# Patient Record
Sex: Female | Born: 1960 | Race: Asian | Hispanic: No | Marital: Married | Smoking: Never smoker
Health system: Southern US, Community
[De-identification: ages and names within clinical notes are randomized; demographics above are authoritative.]

---

## 2003-03-27 ENCOUNTER — Other Ambulatory Visit: Admission: RE | Admit: 2003-03-27 | Discharge: 2003-03-27 | Payer: Self-pay | Admitting: Obstetrics and Gynecology

## 2003-05-19 ENCOUNTER — Ambulatory Visit (HOSPITAL_COMMUNITY): Admission: RE | Admit: 2003-05-19 | Discharge: 2003-05-19 | Payer: Self-pay | Admitting: Obstetrics and Gynecology

## 2003-09-29 ENCOUNTER — Encounter (INDEPENDENT_AMBULATORY_CARE_PROVIDER_SITE_OTHER): Payer: Self-pay | Admitting: Specialist

## 2003-09-29 ENCOUNTER — Inpatient Hospital Stay (HOSPITAL_COMMUNITY): Admission: AD | Admit: 2003-09-29 | Discharge: 2003-10-02 | Payer: Self-pay | Admitting: Obstetrics and Gynecology

## 2004-03-30 ENCOUNTER — Other Ambulatory Visit: Admission: RE | Admit: 2004-03-30 | Discharge: 2004-03-30 | Payer: Self-pay | Admitting: Obstetrics and Gynecology

## 2004-06-09 ENCOUNTER — Ambulatory Visit (HOSPITAL_COMMUNITY): Admission: RE | Admit: 2004-06-09 | Discharge: 2004-06-09 | Payer: Self-pay | Admitting: Obstetrics and Gynecology

## 2004-06-09 ENCOUNTER — Encounter: Payer: Self-pay | Admitting: Obstetrics and Gynecology

## 2004-06-09 ENCOUNTER — Encounter (INDEPENDENT_AMBULATORY_CARE_PROVIDER_SITE_OTHER): Payer: Self-pay | Admitting: Specialist

## 2006-01-23 ENCOUNTER — Other Ambulatory Visit: Admission: RE | Admit: 2006-01-23 | Discharge: 2006-01-23 | Payer: Self-pay | Admitting: Obstetrics and Gynecology

## 2006-01-24 ENCOUNTER — Ambulatory Visit (HOSPITAL_COMMUNITY): Admission: RE | Admit: 2006-01-24 | Discharge: 2006-01-24 | Payer: Self-pay | Admitting: Family Medicine

## 2007-01-29 ENCOUNTER — Ambulatory Visit (HOSPITAL_COMMUNITY): Admission: RE | Admit: 2007-01-29 | Discharge: 2007-01-29 | Payer: Self-pay | Admitting: Family Medicine

## 2008-01-28 ENCOUNTER — Encounter: Admission: RE | Admit: 2008-01-28 | Discharge: 2008-01-28 | Payer: Self-pay | Admitting: Family Medicine

## 2008-01-30 ENCOUNTER — Ambulatory Visit (HOSPITAL_COMMUNITY): Admission: RE | Admit: 2008-01-30 | Discharge: 2008-01-30 | Payer: Self-pay | Admitting: Family Medicine

## 2008-02-20 ENCOUNTER — Encounter: Admission: RE | Admit: 2008-02-20 | Discharge: 2008-02-20 | Payer: Self-pay | Admitting: Family Medicine

## 2009-02-02 ENCOUNTER — Ambulatory Visit (HOSPITAL_COMMUNITY): Admission: RE | Admit: 2009-02-02 | Discharge: 2009-02-02 | Payer: Self-pay | Admitting: Family Medicine

## 2010-02-15 ENCOUNTER — Ambulatory Visit (HOSPITAL_COMMUNITY)
Admission: RE | Admit: 2010-02-15 | Discharge: 2010-02-15 | Payer: Self-pay | Source: Home / Self Care | Admitting: Family Medicine

## 2010-04-03 ENCOUNTER — Encounter: Payer: Self-pay | Admitting: Obstetrics and Gynecology

## 2010-07-30 NOTE — Op Note (Signed)
NAMEPRABHLEEN, [name] [name]              ACCOUNT NO.:  [id number]   MEDICAL RECORD NO.:  [id number]          PATIENT TYPE:  AMB   LOCATION:  SDC                           [facility]:  [facility]   PHYSICIAN:  Hal Morales, M.D.DATE OF BIRTH:  02/19/1961   DATE OF PROCEDURE:  06/09/2004  DATE OF DISCHARGE:                                 OPERATIVE REPORT   PREOPERATIVE DIAGNOSES:  1.  Intrauterine fetal demise at seven weeks; gestation.  2.  Advanced maternal age of 24.   POSTOPERATIVE DIAGNOSES:  1.  Intrauterine fetal demise at seven weeks; gestation.  2.  Advanced maternal age of 54.   OPERATIONS:  Suction dilatation and evacuation.   SURGEON:  Hal Morales, M.D.   ANESTHESIA:  Monitored anesthesia care and local with 0.5% Marcaine with  epinephrine for a total of 10 mL.   ESTIMATED BLOOD LOSS:  Less than 10 mL.   COMPLICATIONS:  None.   FINDINGS:  The uterus was enlarged to approximately eight-week size with a  large amount of products of conception obtained at dilatation and  evacuation.   PROCEDURE:  The patient was taken to the operating room after appropriate  identification and placed on the operating table.  After placement of  equipment for monitored anesthesia care, she was placed in the lithotomy  position.  The perineum and vagina were prepped with multiple layers of  Betadine and a red Robinson catheter used to empty the bladder.  The  perineum was draped as a sterile field.  A Graves speculum was placed in the  vagina and a paracervical block achieved with a total of 10 mL of 0.5%  Marcaine with epinephrine.  The cervix was then dilated to accommodate a #7  suction curette.  That suction curette was used to suction-evacuate all  products of conception from all quadrants of the uterus.  A sharp curette  was used to ensure that all products of conception had been removed.  A  single-tooth tenaculum was placed on the cervix prior to beginning the  dilation.  Once  the suction curettage was complete, all instruments were  removed from the vagina and a suture of 0 Vicryl was used to achieve  hemostasis at the site of the tenaculum stick, which was still bleeding.  Hemostasis was then noted to be adequate and the patient was taken from the  operating room to the recovery room in satisfactory condition having  tolerated the procedure well, with sponge and instrument counts correct. The  specimen went to pathology and to [facility] for chromosome analysis.  Postoperative instructions were  printed instructions for D&E from the [facility].  Discharge  medications:  Ibuprofen over-the-counter 600 mg p.o. q.6h.  p.r.n. pain,  doxycycline 100 mg p.o. b.i.d. for 5 days, and Methergine 0.2 mg p.o. q.6h.  for a total of eight doses.  Follow-up is in two weeks.      VPH/MEDQ  D:  06/09/2004  T:  06/09/2004  Job:  [id number]

## 2010-07-30 NOTE — Discharge Summary (Signed)
[name] [name]                        ACCOUNT NO.:  [id number]   MEDICAL RECORD NO.:  [id number]                   PATIENT TYPE:  INP   LOCATION:  9311                                 [facility]ILITY:  [facility]   PHYSICIAN:  Janine Limbo, M.D.            DATE OF BIRTH:  [birth year]   DATE OF ADMISSION:  09/29/2003  DATE OF DISCHARGE:  10/02/2003                                 DISCHARGE SUMMARY   ADMISSION DIAGNOSES:  1. Intrauterine pregnancy at [redacted] weeks gestation.  2. Intrauterine fetal demise.  3. Unfavorable cervix.  4. Abnormal fetal lie.   DISCHARGE DIAGNOSES:  1. Intrauterine pregnancy at [redacted] weeks gestation.  2. Intrauterine fetal demise.  3. Unfavorable cervix.  4. Abnormal fetal lie.  5. Status post cesarean section delivery.  6. Appropriate grief process.   PROCEDURES THIS ADMISSION:  Primary low transverse cesarean section for  delivery of a nonviable female infant named [name] [name] who weighed 8 pounds 10  ounces on September 29, 2003 attended in delivery by Dr. Leonard Schwartz  and Nigel Bridgeman, C.N.M.   HOSPITAL COURSE:  [name] [name] is a 50 year old married Filipino female  gravida 1 para 0 at 58 and four-sevenths weeks who presented on admission  with an intrauterine fetal demise discovered at the office.  She was  admitted for delivery and underwent a cesarean section for delivery due to  abnormal fetal lie in the transverse position.  She was delivered of a  nonviable female infant who had Apgars of 0 and 0 and weighed 8 pounds 10  ounces.  They did name him [name] [name].  He was delivered by Dr. Leonard Schwartz and Nigel Bridgeman, C.N.M.  Please see operative note for further  details.  Postoperatively the patient has done well within the appropriate  grief process.  She is ambulating, voiding, and eating without difficulty.  Her vital signs have been stable and she has been essentially afebrile  throughout her hospital course but did have one temperature the  evening of  July 20 that was 100.5.  She is deemed ready for discharge today.   DISCHARGE INSTRUCTIONS:  Call for fever, increased bleeding, or signs or  symptoms of increasing depression.   DISCHARGE MEDICATIONS:  1. Motrin 600 mg p.o. q.6h. p.r.n. for pain.  2. Tylox one to two p.o. q.4-6h. p.r.n. for pain.   DISCHARGE LABORATORY DATA:  Today she had a CBC that had a wbc count of  13.5, down from 15, hemoglobin of 9.2, and platelets 349.  She has multiple  other tests pending for coag studies, hormonal studies, and thrombophilia  screening, as well as TORCH titers.   FOLLOW-UP:  Her discharge follow-up will be in 4-6 weeks at [facility] or p.r.n. and she was also referred to [facility] support group in  [place] for parents with loss.   DISCHARGE STATUS:  Well and stable.     Vance Gather  J. Duplantis, C.N.M.              Janine Limbo, M.D.    SJD/MEDQ  D:  10/02/2003  T:  10/03/2003  Job:  [id number]

## 2010-07-30 NOTE — Op Note (Signed)
[name] [name], [name] [name]                        ACCOUNT NO.:  [id number]   MEDICAL RECORD NO.:  [id number]                   PATIENT TYPE:  INP   LOCATION:  9311                                 [facility]   PHYSICIAN:  Janine Limbo, M.D.            DATE OF BIRTH:  30-Jan-1961   DATE OF PROCEDURE:  09/29/2003  DATE OF DISCHARGE:                                 OPERATIVE REPORT   PREOPERATIVE DIAGNOSES:  1. [redacted] weeks gestation.  2. Intrauterine fetal demise.  3. Transverse presentation.   POSTOPERATIVE DIAGNOSES:  1. [redacted] weeks gestation.  2. Intrauterine fetal demise.  3. Transverse presentation.   PROCEDURE:  Primary low transverse cesarean section.   SURGEON:  Janine Limbo, M.D.   FIRST ASSISTANT:  Renaldo Reel. Emilee Hero, C.N.M.   ANESTHESIA:  Epidural.   DISPOSITION:  [name] is a 50 year old female, gravida 1, para 0, who  presents at [redacted] weeks gestation.  The patient reports not feeling her baby  move and on presentation to the office today no fetal heart tones could be  appreciated. An ultrasound was performed that confirmed an intrauterine  fetal demise. The patient denies bleeding and leakage of fluid.  The  ultrasound showed that the infant was in a transverse presentation.  The  patient's cervix was closed and long.  We discussed the options for  management with the patient.  Her husband was also present. We offered an  attempt at external version.  We would then plan induction of labor.  We  also offered a cesarean delivery. The risk and benefits of each of those  options were reviewed. After considering her options, the patient and her  husband elected not to have an external version but rather to proceed with  cesarean delivery. We reviewed the risk of cesarean delivery which included,  but are not limited to, anesthetic complications, bleeding, infections, and  possible damage to the surrounding organs.   FINDINGS:  The weight of the infant is currently  not known. A female infant  was delivered from a back up transverse presentation.  The Apgars were 0 at  1 minute and 0 at 5 minutes. The skin on the infant was starting to peel and  this was thought to be consistent with an intrauterine fetal demise of  several days duration.  The skull was still firm.  The intrauterine cavity  was evaluated and no abnormalities were noted.  The remainder of the pelvic  exam was normal. There was some meconium stained amniotic fluid present.  The placenta appeared completely normal.  The cord was thrombosed but there  were no knots and there were no nuchal cords or body cords present. The  amniotic fluid was not foul smelling and there was no evidence of infection.   DESCRIPTION OF PROCEDURE:  The patient was taken to the operating room where  an epidural anesthetic was given. The patient's abdomen was prepped  with  multiple layers of Betadine as was the perineum.  A Foley catheter was  placed in the bladder.  The patient was then sterilely draped. The lower  abdomen was injected with 10 mL of 0.5% Marcaine with epinephrine.  A low  transverse incision was made and the incision was carried sharply through  the subcutaneous tissue, the fascia and the anterior peritoneum.  The lower  uterine segment was incised and the bladder flap was developed.  The  incision was extended in a low transverse fashion. The infant was then  rotated to a vertex presentation. The fetal head was delivered.  The Mighty  Vac vacuum extractor was used.  The remainder of the infant was then  delivered. The cord was clamped and cut and the infant was taken to be  cleaned and dressed.  The skin was starting to break down as mentioned  above. The placenta was removed and the cavity was explored. The cavity was  then cleaned. The uterine incision was closed using a running locking suture  of 2-0 Vicryl followed by an imbricating suture of 2-0 Vicryl.  Figure-of-  eight sutures of 2-0  Vicryl were used for hemostasis.  The pelvis was  vigorously irrigated. No abnormalities were noted.  The anterior peritoneum  and then the abdominal musculature were reapproximated in the midline using  3-0 Vicryl.  The subcutaneous layer and the fascia were irrigated. The  fascia was closed using a running suture of #0 Vicryl followed by three  interrupted sutures of 3-0 Vicryl.  Hemostasis was noted to be adequate. The  subcutaneous layer was then closed using #0 Vicryl. The skin was  reapproximated using a subcuticular suture of 4-0 Vicryl.  Sponge, needle  and instrument counts were correct on two occasions. The estimated blood  loss for the procedure was 800 mL.  The patient tolerated the procedure  well.  The infant was taken back to Labor and Delivery and preparations were  made for funeral services. The mother was taken to the recovery room in  stable condition. The placenta was sent to pathology for evaluation.                                               Janine Limbo, M.D.    AVS/MEDQ  D:  09/29/2003  T:  09/30/2003  Job:  [id number]

## 2010-07-30 NOTE — H&P (Signed)
NAME:  [name] [name], [name] [name]                        ACCOUNT NO.:  [id number]   MEDICAL RECORD NO.:  [id number]                   PATIENT TYPE:  INP   LOCATION:  9176                                 [facility]   PHYSICIAN:  Janine Limbo, M.D.            DATE OF BIRTH:  16-Jun-1960   DATE OF ADMISSION:  09/29/2003  DATE OF DISCHARGE:                                HISTORY & PHYSICAL   HISTORY OF PRESENT ILLNESS:  Ms. Vasallo is a 50 year old gravida 1 para 0 at  88 and four-sevenths weeks who presents for admission for an intrauterine  fetal demise discovered today i the office.  The patient reports no fetal  movement since yesterday with one movement noted that day.  She denies  abdominal pain, bleeding, discharge, trauma, headache, or any other symptom.  The patient, of course, is very distraught at this news.  Ultrasound today  showed an IUFD, transverse lie, estimated fetal weight 60th to 61st  percentile.  Pregnancy has been remarkable for:  1. Advanced maternal age with amniocentesis declined with a normal quadruple     screen and positive nasal bone seen.  2. Irregular cycles.  3. 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   PRENATAL LABORATORY DATA:  Blood type is B positive, Rh antibody negative.  VDRL nonreactive.  Rubella titer positive.  Hepatitis B surface antigen  negative.  HIV nonreactive.  GC and chlamydia cultures were negative at  first visit.  Pap was normal.  Cystic fibrosis testing was negative.  Quadruple screen was normal.  Hemoglobin upon entering the practice was  13.3; it was 12.9 at 27 weeks.  RPR was also nonreactive at that time.  EDC  of October 02, 2003 was established by first trimester ultrasound at 13 weeks  secondary to history of irregular cycles and was in agreement with  ultrasound at approximately 18 weeks.  Group B strep culture, chlamydia, and  GBS were done at 36 weeks and were negative.   HISTORY OF PRESENT PREGNANCY:  The patient entered care at approximately  12  weeks.  She was initially undecided regarding amniocentesis but then did  decide against that.  She elected a quadruple screen which was normal.  She  had a normal ultrasound at 18 weeks that was done at [facility] that  showed normal cervical growth, normal anatomy, good fluid, nasal bone seen.  Her Glucola was normal at 110.  RPR was nonreactive at 27 weeks.  She had a  total of a 26-pound weight gain with a 4-pound loss in the last 6 days.  The  patient had no other problems.  Her last visit was in the office on July 12  at which time she was having no difficulty.  She was to have an ultrasound  at her follow-up visit for size and position.  Ultrasound today showed a  nonviable fetus in a transverse lie, estimated weight of 3514 g to  3527 g, 7  pounds 12 ounces, that presents the 60th to 61st percentile.   OBSTETRICAL HISTORY:  The patient is a primigravida.   MEDICAL HISTORY:  She had Trichomonas which was treated with metronidazole  10 years ago.  She reports the usual childhood illnesses.  She has a history  of a positive PPD with a negative chest x-ray in 2000.  She had asthma as a  child but no problems after that.  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   ALLERGIES:  She has no known medication allergies.   FAMILY HISTORY:  Father has heart disease.  Father and brother and paternal  uncle have hypertension.  Maternal grandfather and paternal grandfather had  a history of TB.  There is a questionable family history of rheumatoid  arthritis.   GENETIC HISTORY:  Remarkable for the patient being age 64 at the time of  delivery.  The father-of-the-baby's brother has a cleft lip.  The patient's  brothers and sisters have __________ and glaucoma.  The patient's mother and  father are second cousins.  The patient's brother and cousins are twins.  The father of the baby's is 29 years old.   SOCIAL HISTORY:  The patient is married to the father of the baby.  He is  involved  and supportive.  His name is Keiasia Christianson.  The patient is [place].  She is of the [employer].  She is college-educated and employed as  a Designer, jewellery.  Her husband has 2 years of college.  He is working  toward his L.P.N.  The patient is employed at the [employer].  The patient denies any alcohol, drug, or tobacco use during this pregnancy.   PHYSICAL EXAMINATION:  VITAL SIGNS:  Stable; the patient is afebrile.  HEENT:  Within normal limits.  LUNGS:  Bilateral breath sounds are clear.  HEART:  Regular rate and rhythm without murmur.  BREASTS:  Soft and nontender.  ABDOMEN:  Fundal height is approximately 39-40 cm.  PELVIC:  Closed, soft, with no presenting part noted in the pelvis.  EXTREMITIES:  Deep tendon reflexes are 2+ without clonus.  There is a trace  edema noted.   Clean catch urine was negative at the office.   IMPRESSION:  1. Intrauterine fetal demise at 43 and four-sevenths weeks.  2. Unripe cervix.  3. Abnormal lie.   PLAN:  1. Admit to [facility] per consult with Dr. Stefano Gaul as     attending physician.  2. Routine physician orders.  3. TORCH titers, comprehensive metabolic panel with admit labs.  4. Support the patient and husband regarding loss.  5. Review status of the cervix with the patient and her husband.  6. Dr. Stefano Gaul will follow the patient and determine the plan of care.     Renaldo Reel Emilee Hero, C.N.M.                   Janine Limbo, M.D.    VLL/MEDQ  D:  09/29/2003  T:  09/29/2003  Job:  [id number]

## 2011-01-27 ENCOUNTER — Other Ambulatory Visit (HOSPITAL_COMMUNITY): Payer: Self-pay | Admitting: Family Medicine

## 2011-01-27 DIAGNOSIS — Z1231 Encounter for screening mammogram for malignant neoplasm of breast: Secondary | ICD-10-CM

## 2011-02-28 ENCOUNTER — Ambulatory Visit (HOSPITAL_COMMUNITY): Admission: RE | Admit: 2011-02-28 | Payer: Self-pay | Source: Ambulatory Visit

## 2011-03-02 ENCOUNTER — Ambulatory Visit (HOSPITAL_COMMUNITY)
Admission: RE | Admit: 2011-03-02 | Discharge: 2011-03-02 | Disposition: A | Discharge status: Home / Self Care | Payer: PRIVATE HEALTH INSURANCE | Source: Ambulatory Visit | Attending: Family Medicine | Admitting: Family Medicine

## 2011-03-02 DIAGNOSIS — Z1231 Encounter for screening mammogram for malignant neoplasm of breast: Secondary | ICD-10-CM | POA: Insufficient documentation

## 2011-03-09 ENCOUNTER — Other Ambulatory Visit: Payer: Self-pay | Admitting: Family Medicine

## 2011-03-09 DIAGNOSIS — R928 Other abnormal and inconclusive findings on diagnostic imaging of breast: Secondary | ICD-10-CM

## 2011-03-16 ENCOUNTER — Ambulatory Visit
Admission: RE | Admit: 2011-03-16 | Discharge: 2011-03-16 | Disposition: A | Discharge status: Home / Self Care | Payer: PRIVATE HEALTH INSURANCE | Source: Ambulatory Visit | Attending: Family Medicine | Admitting: Family Medicine

## 2011-03-16 ENCOUNTER — Other Ambulatory Visit: Payer: Self-pay | Admitting: Family Medicine

## 2011-03-16 DIAGNOSIS — R921 Mammographic calcification found on diagnostic imaging of breast: Secondary | ICD-10-CM

## 2011-03-16 DIAGNOSIS — R928 Other abnormal and inconclusive findings on diagnostic imaging of breast: Secondary | ICD-10-CM

## 2011-03-23 ENCOUNTER — Ambulatory Visit
Admission: RE | Admit: 2011-03-23 | Discharge: 2011-03-23 | Disposition: A | Discharge status: Home / Self Care | Payer: PRIVATE HEALTH INSURANCE | Source: Ambulatory Visit | Attending: Family Medicine | Admitting: Family Medicine

## 2011-03-23 DIAGNOSIS — R921 Mammographic calcification found on diagnostic imaging of breast: Secondary | ICD-10-CM

## 2011-03-24 ENCOUNTER — Other Ambulatory Visit: Payer: Self-pay | Admitting: Family Medicine

## 2011-03-24 DIAGNOSIS — R921 Mammographic calcification found on diagnostic imaging of breast: Secondary | ICD-10-CM

## 2011-03-29 ENCOUNTER — Ambulatory Visit
Admission: RE | Admit: 2011-03-29 | Discharge: 2011-03-29 | Disposition: A | Discharge status: Home / Self Care | Payer: PRIVATE HEALTH INSURANCE | Source: Ambulatory Visit | Attending: Family Medicine | Admitting: Family Medicine

## 2011-03-29 DIAGNOSIS — R921 Mammographic calcification found on diagnostic imaging of breast: Secondary | ICD-10-CM

## 2011-04-06 ENCOUNTER — Ambulatory Visit (INDEPENDENT_AMBULATORY_CARE_PROVIDER_SITE_OTHER): Payer: Self-pay | Admitting: Surgery

## 2011-04-11 ENCOUNTER — Ambulatory Visit (INDEPENDENT_AMBULATORY_CARE_PROVIDER_SITE_OTHER): Payer: PRIVATE HEALTH INSURANCE | Admitting: General Surgery

## 2011-09-12 ENCOUNTER — Other Ambulatory Visit: Payer: Self-pay | Admitting: Family Medicine

## 2011-09-12 DIAGNOSIS — R921 Mammographic calcification found on diagnostic imaging of breast: Secondary | ICD-10-CM

## 2011-09-20 ENCOUNTER — Ambulatory Visit
Admission: RE | Admit: 2011-09-20 | Discharge: 2011-09-20 | Disposition: A | Discharge status: Home / Self Care | Payer: PRIVATE HEALTH INSURANCE | Source: Ambulatory Visit | Attending: Family Medicine | Admitting: Family Medicine

## 2011-09-20 DIAGNOSIS — R921 Mammographic calcification found on diagnostic imaging of breast: Secondary | ICD-10-CM

## 2012-02-06 ENCOUNTER — Other Ambulatory Visit: Payer: Self-pay | Admitting: Family Medicine

## 2012-02-06 DIAGNOSIS — N644 Mastodynia: Secondary | ICD-10-CM

## 2012-02-10 ENCOUNTER — Other Ambulatory Visit: Payer: Self-pay | Admitting: Family Medicine

## 2012-02-10 ENCOUNTER — Ambulatory Visit
Admission: RE | Admit: 2012-02-10 | Discharge: 2012-02-10 | Disposition: A | Discharge status: Home / Self Care | Payer: PRIVATE HEALTH INSURANCE | Source: Ambulatory Visit | Attending: Family Medicine | Admitting: Family Medicine

## 2012-02-10 DIAGNOSIS — N644 Mastodynia: Secondary | ICD-10-CM

## 2012-04-03 ENCOUNTER — Ambulatory Visit
Admission: RE | Admit: 2012-04-03 | Discharge: 2012-04-03 | Disposition: A | Discharge status: Home / Self Care | Payer: PRIVATE HEALTH INSURANCE | Source: Ambulatory Visit | Attending: Family Medicine | Admitting: Family Medicine

## 2012-04-03 DIAGNOSIS — N644 Mastodynia: Secondary | ICD-10-CM

## 2012-08-08 ENCOUNTER — Encounter (HOSPITAL_COMMUNITY): Payer: Self-pay | Admitting: Emergency Medicine

## 2012-08-08 DIAGNOSIS — Y929 Unspecified place or not applicable: Secondary | ICD-10-CM | POA: Insufficient documentation

## 2012-08-08 DIAGNOSIS — Y9389 Activity, other specified: Secondary | ICD-10-CM | POA: Insufficient documentation

## 2012-08-08 DIAGNOSIS — S61209A Unspecified open wound of unspecified finger without damage to nail, initial encounter: Secondary | ICD-10-CM | POA: Insufficient documentation

## 2012-08-08 DIAGNOSIS — W460XXA Contact with hypodermic needle, initial encounter: Secondary | ICD-10-CM | POA: Insufficient documentation

## 2012-08-08 NOTE — ED Notes (Signed)
PT. ACCIDENTALLY STUCK HER LEFT 5TH FINGER WITH INSULIN SYRINGE NEEDLE AFTER GIVING INSULIN TO A PT. THIS EVENING . NO BLEEDING .

## 2012-08-09 ENCOUNTER — Emergency Department (HOSPITAL_COMMUNITY)
Admission: EM | Admit: 2012-08-09 | Discharge: 2012-08-09 | Disposition: A | Discharge status: Home / Self Care | Payer: Worker's Compensation | Attending: Emergency Medicine | Admitting: Emergency Medicine

## 2012-08-09 DIAGNOSIS — IMO0001 Reserved for inherently not codable concepts without codable children: Secondary | ICD-10-CM

## 2012-08-09 LAB — HEPATIC FUNCTION PANEL
ALT: 31 U/L (ref 0–35)
AST: 29 U/L (ref 0–37)
Alkaline Phosphatase: 54 U/L (ref 39–117)
Bilirubin, Direct: <0.1 mg/dL (ref 0.0–0.3)
Total Protein: 7.8 g/dL (ref 6.0–8.3)

## 2012-08-09 LAB — HIV ANTIBODY (ROUTINE TESTING W REFLEX): HIV: NONREACTIVE

## 2012-08-09 NOTE — ED Provider Notes (Signed)
History     CSN: [id number]  Arrival date & time 08/08/12  2203   First MD Initiated Contact with Patient 08/09/12 0119      Chief Complaint  Patient presents with  . Finger Injury    (Consider location/radiation/quality/duration/timing/severity/associated sxs/prior treatment) HPI [name] [name] is a 52 y.o. female presents emergency department after accidentally sticking herself with a contaminated syringe while at work.  Patient states that she is here with papers for work.  Asymptomatic at this time.  Encounter HIV and hepatitis status currently unknown.  Patient reports flushing and cleaning the site thoroughly.  History reviewed. No pertinent past medical history.  History reviewed. No pertinent past surgical history.  No family history on file.  History  Substance Use Topics  . Smoking status: Never Smoker   . Smokeless tobacco: Not on file  . Alcohol Use: No    OB History   Grav Para Term Preterm Abortions TAB SAB Ect Mult Living                  Review of Systems Ten systems reviewed and are negative for acute change, except as noted in the HPI.   Allergies  Review of patient's allergies indicates no known allergies.  Home Medications  No current outpatient prescriptions on file.  BP 132/77  Pulse 64  Temp(Src) 97.9 F (36.6 C) (Oral)  Resp 16  SpO2 100%  Physical Exam  Constitutional: She is oriented to person, place, and time. She appears well-developed and well-nourished. No distress.  HENT:  Head: Normocephalic and atraumatic.  Mouth/Throat: Oropharynx is clear and moist. No oropharyngeal exudate.  Eyes: Conjunctivae and EOM are normal. Pupils are equal, round, and reactive to light. No scleral icterus.  Neck: Normal range of motion. Neck supple. No tracheal deviation present. No thyromegaly present.  Cardiovascular: Normal rate, regular rhythm, normal heart sounds and intact distal pulses.   Pulmonary/Chest: Effort normal and breath sounds  normal. No stridor. No respiratory distress. She has no wheezes.  Abdominal: Soft.  Musculoskeletal: Normal range of motion. She exhibits no edema and no tenderness.  Neurological: She is alert and oriented to person, place, and time. Coordination normal.  Skin: Skin is warm and dry. No rash noted. She is not diaphoretic. No erythema. No pallor.  Psychiatric: She has a normal mood and affect. Her behavior is normal.    ED Course  Procedures (including critical care time)  Labs Reviewed  HEPATIC FUNCTION PANEL  HIV ANTIBODY (ROUTINE TESTING)  HEPATITIS C ANTIBODY   No results found.   1. Needle stick injury, initial encounter       MDM  Labs ordered for baseline based on [place] protocol for needle stick injury.  Patient advised to contact her employee health to assure that he in patients labs were drawn as well.  Jaci Carrel, [place] 08/09/12 [phone]

## 2012-08-09 NOTE — ED Notes (Signed)
Pt comfortable with d/c and f/u instructions. No prescriptions 

## 2012-08-11 NOTE — ED Provider Notes (Signed)
Medical screening examination/treatment/procedure(s) were performed by non-physician practitioner and as supervising physician I was immediately available for consultation/collaboration.   Julie Manly, MD 08/11/12 0015 

## 2013-02-28 ENCOUNTER — Other Ambulatory Visit: Payer: Self-pay | Admitting: Family Medicine

## 2013-02-28 DIAGNOSIS — R921 Mammographic calcification found on diagnostic imaging of breast: Secondary | ICD-10-CM

## 2013-04-10 ENCOUNTER — Other Ambulatory Visit: Payer: Self-pay | Admitting: Family Medicine

## 2013-04-10 ENCOUNTER — Ambulatory Visit
Admission: RE | Admit: 2013-04-10 | Discharge: 2013-04-10 | Disposition: A | Discharge status: Home / Self Care | Payer: PRIVATE HEALTH INSURANCE | Source: Ambulatory Visit | Attending: Family Medicine | Admitting: Family Medicine

## 2013-04-10 DIAGNOSIS — R921 Mammographic calcification found on diagnostic imaging of breast: Secondary | ICD-10-CM

## 2013-05-06 ENCOUNTER — Other Ambulatory Visit: Payer: Self-pay | Admitting: Family Medicine

## 2013-05-06 ENCOUNTER — Other Ambulatory Visit (HOSPITAL_COMMUNITY)
Admission: RE | Admit: 2013-05-06 | Discharge: 2013-05-06 | Disposition: A | Discharge status: Home / Self Care | Payer: PRIVATE HEALTH INSURANCE | Source: Ambulatory Visit | Attending: Family Medicine | Admitting: Family Medicine

## 2013-05-06 DIAGNOSIS — Z124 Encounter for screening for malignant neoplasm of cervix: Secondary | ICD-10-CM | POA: Insufficient documentation

## 2014-02-25 ENCOUNTER — Other Ambulatory Visit: Payer: Self-pay

## 2014-02-25 DIAGNOSIS — Z1231 Encounter for screening mammogram for malignant neoplasm of breast: Secondary | ICD-10-CM

## 2014-04-14 ENCOUNTER — Ambulatory Visit
Admission: RE | Admit: 2014-04-14 | Discharge: 2014-04-14 | Disposition: A | Discharge status: Home / Self Care | Payer: PRIVATE HEALTH INSURANCE | Source: Ambulatory Visit

## 2014-04-14 DIAGNOSIS — Z1231 Encounter for screening mammogram for malignant neoplasm of breast: Secondary | ICD-10-CM

## 2014-06-01 ENCOUNTER — Ambulatory Visit: Payer: Self-pay

## 2014-06-01 ENCOUNTER — Ambulatory Visit (INDEPENDENT_AMBULATORY_CARE_PROVIDER_SITE_OTHER): Payer: Worker's Compensation | Admitting: Family Medicine

## 2014-06-01 VITALS — BP 120/60 | HR 70 | Temp 98.5°F | Resp 16 | Ht 59.0 in | Wt 102.4 lb

## 2014-06-01 DIAGNOSIS — S76011A Strain of muscle, fascia and tendon of right hip, initial encounter: Secondary | ICD-10-CM | POA: Diagnosis not present

## 2014-06-01 DIAGNOSIS — M25551 Pain in right hip: Secondary | ICD-10-CM

## 2014-06-01 DIAGNOSIS — M7071 Other bursitis of hip, right hip: Secondary | ICD-10-CM

## 2014-06-01 MED ORDER — IBUPROFEN 800 MG PO TABS: 800.0000 mg | ORAL_TABLET | Freq: Three times a day (TID) | ORAL | Status: DC | PRN

## 2014-06-01 NOTE — Patient Instructions (Signed)
Hip Bursitis Bursitis is a swelling and soreness (inflammation) of a fluid-filled sac (bursa). This sac overlies and protects the joints.  CAUSES   Injury.  Overuse of the muscles surrounding the joint.  Arthritis.  Gout.  Infection.  Cold weather.  Inadequate warm-up and conditioning prior to activities. The cause may not be known.  SYMPTOMS   Mild to severe irritation.  Tenderness and swelling over the outside of the hip.  Pain with motion of the hip.  If the bursa becomes infected, a fever may be present. Redness, tenderness, and warmth will develop over the hip. Symptoms usually lessen in 3 to 4 weeks with treatment, but can come back. TREATMENT If conservative treatment does not work, your caregiver may advise draining the bursa and injecting cortisone into the area. This may speed up the healing process. This may also be used as an initial treatment of choice. HOME CARE INSTRUCTIONS   Apply ice to the affected area for 15-20 minutes every 3 to 4 hours while awake for the first 2 days. Put the ice in a plastic bag and place a towel between the bag of ice and your skin.  Rest the painful joint as much as possible, but continue to put the joint through a normal range of motion at least 4 times per day. When the pain lessens, begin normal, slow movements and usual activities to help prevent stiffness of the hip.  Only take over-the-counter or prescription medicines for pain, discomfort, or fever as directed by your caregiver.  Use crutches to limit weight bearing on the hip joint, if advised.  Elevate your painful hip to reduce swelling. Use pillows for propping and cushioning your legs and hips.  Gentle massage may provide comfort and decrease swelling. SEEK IMMEDIATE MEDICAL CARE IF:   Your pain increases even during treatment, or you are not improving.  You have a fever.  You have heat and inflammation over the involved bursa.  You have any other questions or  concerns. MAKE SURE YOU:   Understand these instructions.  Will watch your condition.  Will get help right away if you are not doing well or get worse. Document Released: 08/20/2001 Document Revised: 05/23/2011 Document Reviewed: 03/19/2008 [facility] Patient Information 2015 [place], [place]. This information is not intended to replace advice given to you by your health care provider. Make sure you discuss any questions you have with your health care provider. Trochanteric Bursitis You have hip pain due to trochanteric bursitis. Bursitis means that the sack near the outside of the hip is filled with fluid and inflamed. This sack is made up of protective soft tissue. The pain from trochanteric bursitis can be severe and keep you from sleep. It can radiate to the buttocks or down the outside of the thigh to the knee. The pain is almost always worse when rising from the seated or lying position and with walking. Pain can improve after you take a few steps. It happens more often in people with hip joint and lumbar spine problems, such as arthritis or previous surgery. Very rarely the trochanteric bursa can become infected, and antibiotics and/or surgery may be needed. Treatment often includes an injection of local anesthetic mixed with cortisone medicine. This medicine is injected into the area where it is most tender over the hip. Repeat injections may be necessary if the response to treatment is slow. You can apply ice packs over the tender area for 30 minutes every 2 hours for the next few days. Anti-inflammatory  and/or narcotic pain medicine may also be helpful. Limit your activity for the next few days if the pain continues. See your caregiver in 5-10 days if you are not greatly improved.  SEEK IMMEDIATE MEDICAL CARE IF:  You develop severe pain, fever, or increased redness.  You have pain that radiates below the knee. EXERCISES STRETCHING EXERCISES - Trochanteric Bursitis  These exercises may  help you when beginning to rehabilitate your injury. Your symptoms may resolve with or without further involvement from your physician, physical therapist, or athletic trainer. While completing these exercises, remember:   Restoring tissue flexibility helps normal motion to return to the joints. This allows healthier, less painful movement and activity.  An effective stretch should be held for at least 30 seconds.  A stretch should never be painful. You should only feel a gentle lengthening or release in the stretched tissue. STRETCH - Iliotibial Band  On the floor or bed, lie on your side so your injured leg is on top. Bend your knee and grab your ankle.  Slowly bring your knee back so that your thigh is in line with your trunk. Keep your heel at your buttocks and gently arch your back so your head, shoulders and hips line up.  Slowly lower your leg so that your knee approaches the floor/bed until you feel a gentle stretch on the outside of your thigh. If you do not feel a stretch and your knee will not fall farther, place the heel of your opposite foot on top of your knee and pull your thigh down farther.  Hold this stretch for __________ seconds.  Repeat __________ times. Complete this exercise __________ times per day. STRETCH - Hamstrings, Supine   Lie on your back. Loop a belt or towel over the ball of your foot as shown.  Straighten your knee and slowly pull on the belt to raise your injured leg. Do not allow the knee to bend. Keep your opposite leg flat on the floor.  Raise the leg until you feel a gentle stretch behind your knee or thigh. Hold this position for __________ seconds.  Repeat __________ times. Complete this stretch __________ times per day. STRETCH - Quadriceps, Prone   Lie on your stomach on a firm surface, such as a bed or padded floor.  Bend your knee and grasp your ankle. If you are unable to reach your ankle or pant leg, use a belt around your foot to  lengthen your reach.  Gently pull your heel toward your buttocks. Your knee should not slide out to the side. You should feel a stretch in the front of your thigh and/or knee.  Hold this position for __________ seconds.  Repeat __________ times. Complete this stretch __________ times per day. STRETCHING - Hip Flexors, Lunge Half kneel with your knee on the floor and your opposite knee bent and directly over your ankle.  Keep good posture with your head over your shoulders. Tighten your buttocks to point your tailbone downward; this will prevent your back from arching too much.  You should feel a gentle stretch in the front of your thigh and/or hip. If you do not feel any resistance, slightly slide your opposite foot forward and then slowly lunge forward so your knee once again lines up over your ankle. Be sure your tailbone remains pointed downward.  Hold this stretch for __________ seconds.  Repeat __________ times. Complete this stretch __________ times per day. STRETCH - Adductors, Lunge  While standing, spread your legs.  Lean away from your injured leg by bending your opposite knee. You may rest your hands on your thigh for balance.  You should feel a stretch in your inner thigh. Hold for __________ seconds.  Repeat __________ times. Complete this exercise __________ times per day. Document Released: 04/07/2004 Document Revised: 07/15/2013 Document Reviewed: 06/12/2008 [facility] Patient Information 2015 [place], [place]. This information is not intended to replace advice given to you by your health care provider. Make sure you discuss any questions you have with your health care provider.

## 2014-06-01 NOTE — Progress Notes (Signed)
Subjective:  This chart was scribed for [name] SorensonEva Shaw, MD, by Elon SpannerGarrett Cook, Medical Scribe. This patient was seen in room Rm3 and the patient's care was started at 10:27 AM.   Patient ID: [name] Largeeresita L Ned, female    DOB: 10/15/1960, 54 y.o.   MRN: [id number] Chief Complaint  Patient presents with  . Hip Injury    pt was at work and helped to pull a resident up in bed, now with right hip pain    HPI HPI Comments: [name] [name] is a 54 y.o. female who [facility] complaining of a non-radiating, constant, waxing and waning right lateral hip pain after a lifting injury at work yesterday morning.  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  She reports the pain may have worsened as she continued to walk on the injury yesterday but denies relief without.  Patient has not taken any medications or iced area of complaint.  Patient denies previous history of hip injury.  Patient denies difficulty ambulating, going up stairs.  Patient denies weakness.    History reviewed. No pertinent past medical history. No current outpatient prescriptions on file prior to visit.   No current facility-administered medications on file prior to visit.   No Known Allergies    Review of Systems  Constitutional: Positive for activity change. Negative for fever, chills, diaphoresis, appetite change and fatigue.  Gastrointestinal: Negative for abdominal pain.  Musculoskeletal: Positive for myalgias, arthralgias and gait problem. Negative for back pain, joint swelling and neck pain.  Skin: Negative for color change and rash.  Allergic/Immunologic: Negative for immunocompromised state.  Neurological: Negative for dizziness, tremors, syncope, weakness and numbness.  Hematological: Does not bruise/bleed easily.  Psychiatric/Behavioral: Positive for sleep disturbance.       Objective:  BP 120/60 mmHg  Pulse 70  Temp(Src) 98.5  F (36.9 C) (Oral)  Resp 16  Ht 4\' 11"  (1.499 m)  Wt 102 lb 6 oz (46.437 kg)  BMI 20.67 kg/m2  SpO2 98%  LMP 03/02/2011  Physical Exam  Constitutional: She is oriented to person, place, and time. She appears well-developed and well-nourished. No distress.  HENT:  Head: Normocephalic and atraumatic.  Eyes: Conjunctivae and EOM are normal.  Neck: Neck supple. No tracheal deviation present.  Cardiovascular: Normal rate.   Pulmonary/Chest: Effort normal. No respiratory distress.  Musculoskeletal: Normal range of motion.  No lower extremity edema.  2+ pedal pulses.  Point TTP over greater trochanter with some minimal swelling.  No radiation.  Left hip no tenderness.  FROM, internal/external rotation on left.  FROM on right with flexion, internal/external rotation but does have increased pain with external rotation.   No point tenderness over lumbar spin paraspinal or SI joints.  Antalgic gait  Neurological: She is alert and oriented to person, place, and time. Gait abnormal.  Skin: Skin is warm and dry.  Psychiatric: She has a normal mood and affect. Her behavior is normal.  Nursing note and vitals reviewed.   Primary X-ray Reading by Dr.  Clelia CroftShaw: right hip x-ray normal.  Large stool burden with pelvic phlebolith noted.  Dg Hip Unilat With Pelvis 2-3 Views Right  06/01/2014   CLINICAL DATA:  Right hip pain.  EXAM: RIGHT HIP (WITH PELVIS) 2-3 VIEWS  COMPARISON:  None.  FINDINGS: No fracture. Hip joints, SI joints and symphysis pubis are normally spaced and aligned. Soft tissues are unremarkable.  Electronically Signed   By: Amie Portland M.D.   On: 06/01/2014 12:14       Assessment & Plan:  10:48 AM Discussed suspicion of trochanteric bursitis vs hip strain but will order imagine of hip to r/o other injury. RICE and out of work x 2d w/ minimal ambulation.  Then RTC light duty - minimal lifting, no stairs, reg breaks for icing. Advised patient to start home stretching exercise.  If no  improvement observed in 1-2 week, patient advised to return for potential cortisone injection.  Right hip pain - Plan: DG HIP UNILAT WITH PELVIS 2-3 VIEWS RIGHT  Bursitis of right hip  Meds ordered this encounter  Medications  . Omega-3 Fatty Acids (FISH OIL) 1000 MG CAPS    Sig: Take 1 capsule by mouth daily.  Marland Kitchen ibuprofen (ADVIL,MOTRIN) 800 MG tablet    Sig: Take 1 tablet (800 mg total) by mouth every 8 (eight) hours as needed for moderate pain.    Dispense:  60 tablet    Refill:  0    I personally performed the services described in this documentation, which was scribed in my presence. The recorded information has been reviewed and considered, and addended by me as needed.  [name] Sorenson, MD MPH

## 2014-06-10 ENCOUNTER — Ambulatory Visit (INDEPENDENT_AMBULATORY_CARE_PROVIDER_SITE_OTHER): Payer: Worker's Compensation | Admitting: Family Medicine

## 2014-06-10 VITALS — BP 118/64 | HR 59 | Temp 98.0°F | Resp 20 | Ht 59.0 in | Wt 104.5 lb

## 2014-06-10 DIAGNOSIS — S76012D Strain of muscle, fascia and tendon of left hip, subsequent encounter: Secondary | ICD-10-CM

## 2014-06-10 NOTE — Progress Notes (Signed)
Subjective:    Patient ID: [name] [name], female    DOB: 12/20/1960, 54 y.o.   MRN: [id number]  This chart was scribed for Sherren MochaEva N Jacobs Golab, MD by Ronney LionSuzanne Le, ED Scribe. This patient was seen in room 9 and the patient's care was started at 2:08 PM.   HPI   Chief Complaint  Patient presents with  . Follow-up    follow up with Dr. Clelia CroftShaw for her right hip injury.           HPI Comments: [name] [name] is a 54 y.o. female who presents to the [facility] for a worker's comp claim follow-up for right lateral hip pain following a lifting injury at work that occurred 10 days ago. 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   Today, patient reports she has had no right hip pain since being seen by me 10 days ago. Patient has taken 3 tablets of anti-inflammatory medication and her symptoms afterwards resolved. She does note some back pain when she is pushing something heavy but is otherwise pain-free.  History reviewed. No pertinent past medical history.   Current Outpatient Prescriptions on File Prior to Visit  Medication Sig Dispense Refill  . Omega-3 Fatty Acids (FISH OIL) 1000 MG CAPS Take 1 capsule by mouth daily.     No current facility-administered medications on file prior to visit.    No Known Allergies    Review of Systems  Constitutional: Negative for fever and chills.  HENT: Negative for rhinorrhea and sore throat.   Eyes: Negative for visual disturbance.  Respiratory: Negative for cough and shortness of breath.   Cardiovascular: Negative for chest pain and leg swelling.  Gastrointestinal: Negative for nausea, vomiting, abdominal pain and diarrhea.  Genitourinary: Negative for dysuria.  Musculoskeletal: Negative for myalgias and back pain.  Skin: Negative for rash.  Neurological: Negative for headaches.  Hematological: Does not bruise/bleed easily.       Objective:  BP  118/64 mmHg  Pulse 59  Temp(Src) 98 F (36.7 C) (Oral)  Resp 20  Ht 4\' 11"  (1.499 m)  Wt 104 lb 8 oz (47.401 kg)  BMI 21.10 kg/m2  SpO2 100%  LMP 03/02/2011  Physical Exam  Constitutional: She is oriented to person, place, and time. She appears well-developed and well-nourished. No distress.  HENT:  Head: Normocephalic and atraumatic.  Eyes: Conjunctivae and EOM are normal.  Neck: Neck supple. No tracheal deviation present.  Cardiovascular: Normal rate.   Pulmonary/Chest: Effort normal. No respiratory distress.  Musculoskeletal: Normal range of motion. She exhibits no tenderness.  No TTP over iliac crest or right greater trochanter.  Neurological: She is alert and oriented to person, place, and time.  Reflex Scores:      Patellar reflexes are 2+ on the right side and 2+ on the left side.      Achilles reflexes are 2+ on the right side and 2+ on the left side. Skin: Skin is warm and dry.  Psychiatric: She has a normal mood and affect. Her behavior is normal.  Nursing note and vitals reviewed.      Assessment & Plan:  2:13 PM - Patient appears to be doing very well since her last injury, denying any pain.   Back to full duty and if pt is tolerating that w/o recurrence of sxs will close case. F/U prn   I personally performed the  services described in this documentation, which was scribed in my presence. The recorded information has been reviewed and considered, and addended by me as needed.  Delman Cheadle, MD MPH

## 2015-03-30 ENCOUNTER — Other Ambulatory Visit: Payer: Self-pay

## 2015-03-30 DIAGNOSIS — Z1231 Encounter for screening mammogram for malignant neoplasm of breast: Secondary | ICD-10-CM

## 2015-04-20 ENCOUNTER — Ambulatory Visit: Payer: Self-pay

## 2015-04-29 ENCOUNTER — Ambulatory Visit
Admission: RE | Admit: 2015-04-29 | Discharge: 2015-04-29 | Disposition: A | Discharge status: Home / Self Care | Payer: PRIVATE HEALTH INSURANCE | Source: Ambulatory Visit

## 2015-04-29 DIAGNOSIS — Z1231 Encounter for screening mammogram for malignant neoplasm of breast: Secondary | ICD-10-CM

## 2015-10-13 ENCOUNTER — Ambulatory Visit
Admission: RE | Admit: 2015-10-13 | Discharge: 2015-10-13 | Disposition: A | Discharge status: Home / Self Care | Payer: Managed Care, Other (non HMO) | Source: Ambulatory Visit | Attending: Family Medicine | Admitting: Family Medicine

## 2015-10-13 ENCOUNTER — Other Ambulatory Visit: Payer: Self-pay | Admitting: Family Medicine

## 2015-10-13 DIAGNOSIS — L0291 Cutaneous abscess, unspecified: Secondary | ICD-10-CM

## 2015-10-13 DIAGNOSIS — R05 Cough: Secondary | ICD-10-CM

## 2015-10-13 DIAGNOSIS — R059 Cough, unspecified: Secondary | ICD-10-CM

## 2015-11-20 ENCOUNTER — Other Ambulatory Visit: Payer: Self-pay | Admitting: Family Medicine

## 2015-11-20 ENCOUNTER — Ambulatory Visit
Admission: RE | Admit: 2015-11-20 | Discharge: 2015-11-20 | Disposition: A | Discharge status: Home / Self Care | Payer: Managed Care, Other (non HMO) | Source: Ambulatory Visit | Attending: Family Medicine | Admitting: Family Medicine

## 2015-11-20 DIAGNOSIS — J189 Pneumonia, unspecified organism: Secondary | ICD-10-CM

## 2016-04-25 ENCOUNTER — Other Ambulatory Visit: Payer: Self-pay | Admitting: Family Medicine

## 2016-04-25 DIAGNOSIS — Z1231 Encounter for screening mammogram for malignant neoplasm of breast: Secondary | ICD-10-CM

## 2016-05-09 ENCOUNTER — Ambulatory Visit
Admission: RE | Admit: 2016-05-09 | Discharge: 2016-05-09 | Disposition: A | Discharge status: Home / Self Care | Payer: BLUE CROSS/BLUE SHIELD | Source: Ambulatory Visit | Attending: Family Medicine | Admitting: Family Medicine

## 2016-05-09 DIAGNOSIS — Z1231 Encounter for screening mammogram for malignant neoplasm of breast: Secondary | ICD-10-CM

## 2016-06-15 ENCOUNTER — Other Ambulatory Visit: Payer: Self-pay | Admitting: Family Medicine

## 2016-06-15 ENCOUNTER — Ambulatory Visit
Admission: RE | Admit: 2016-06-15 | Discharge: 2016-06-15 | Disposition: A | Discharge status: Home / Self Care | Payer: BLUE CROSS/BLUE SHIELD | Source: Ambulatory Visit | Attending: Family Medicine | Admitting: Family Medicine

## 2016-06-15 DIAGNOSIS — R042 Hemoptysis: Secondary | ICD-10-CM

## 2016-06-15 DIAGNOSIS — R059 Cough, unspecified: Secondary | ICD-10-CM

## 2016-06-15 DIAGNOSIS — R05 Cough: Secondary | ICD-10-CM | POA: Diagnosis not present

## 2016-06-15 DIAGNOSIS — J329 Chronic sinusitis, unspecified: Secondary | ICD-10-CM | POA: Diagnosis not present

## 2016-06-15 DIAGNOSIS — J309 Allergic rhinitis, unspecified: Secondary | ICD-10-CM | POA: Diagnosis not present

## 2016-10-12 DIAGNOSIS — J069 Acute upper respiratory infection, unspecified: Secondary | ICD-10-CM | POA: Diagnosis not present

## 2017-01-04 ENCOUNTER — Other Ambulatory Visit (HOSPITAL_COMMUNITY)
Admission: RE | Admit: 2017-01-04 | Discharge: 2017-01-04 | Disposition: A | Discharge status: Home / Self Care | Payer: BLUE CROSS/BLUE SHIELD | Source: Ambulatory Visit | Attending: Family Medicine | Admitting: Family Medicine

## 2017-01-04 ENCOUNTER — Other Ambulatory Visit: Payer: Self-pay | Admitting: Family Medicine

## 2017-01-04 DIAGNOSIS — Z13220 Encounter for screening for lipoid disorders: Secondary | ICD-10-CM | POA: Diagnosis not present

## 2017-01-04 DIAGNOSIS — L659 Nonscarring hair loss, unspecified: Secondary | ICD-10-CM | POA: Diagnosis not present

## 2017-01-04 DIAGNOSIS — Z124 Encounter for screening for malignant neoplasm of cervix: Secondary | ICD-10-CM | POA: Insufficient documentation

## 2017-01-04 DIAGNOSIS — Z0000 Encounter for general adult medical examination without abnormal findings: Secondary | ICD-10-CM | POA: Diagnosis not present

## 2017-01-06 LAB — CYTOLOGY - PAP
DIAGNOSIS: NEGATIVE
HPV (WINDOPATH): NOT DETECTED

## 2017-04-11 ENCOUNTER — Other Ambulatory Visit: Payer: Self-pay | Admitting: Family Medicine

## 2017-04-11 DIAGNOSIS — Z1231 Encounter for screening mammogram for malignant neoplasm of breast: Secondary | ICD-10-CM

## 2017-05-11 ENCOUNTER — Ambulatory Visit
Admission: RE | Admit: 2017-05-11 | Discharge: 2017-05-11 | Disposition: A | Discharge status: Home / Self Care | Payer: BLUE CROSS/BLUE SHIELD | Source: Ambulatory Visit | Attending: Family Medicine | Admitting: Family Medicine

## 2017-05-11 DIAGNOSIS — Z1231 Encounter for screening mammogram for malignant neoplasm of breast: Secondary | ICD-10-CM

## 2018-02-28 IMAGING — MG DIGITAL SCREENING BILATERAL MAMMOGRAM WITH TOMO AND CAD
8 series · 8 of 24 positions shown · non-contrast
Comparison: Previous exam(s).

CLINICAL DATA: Screening.

EXAM:
DIGITAL SCREENING BILATERAL MAMMOGRAM WITH TOMO AND CAD

[R MLO synth-2D]
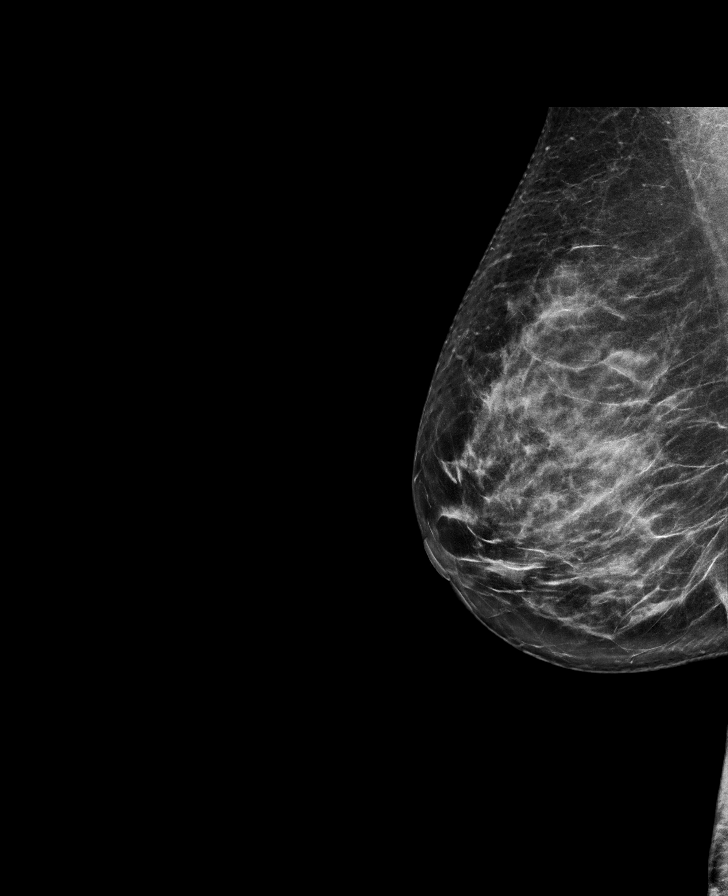

[R CC synth-2D]
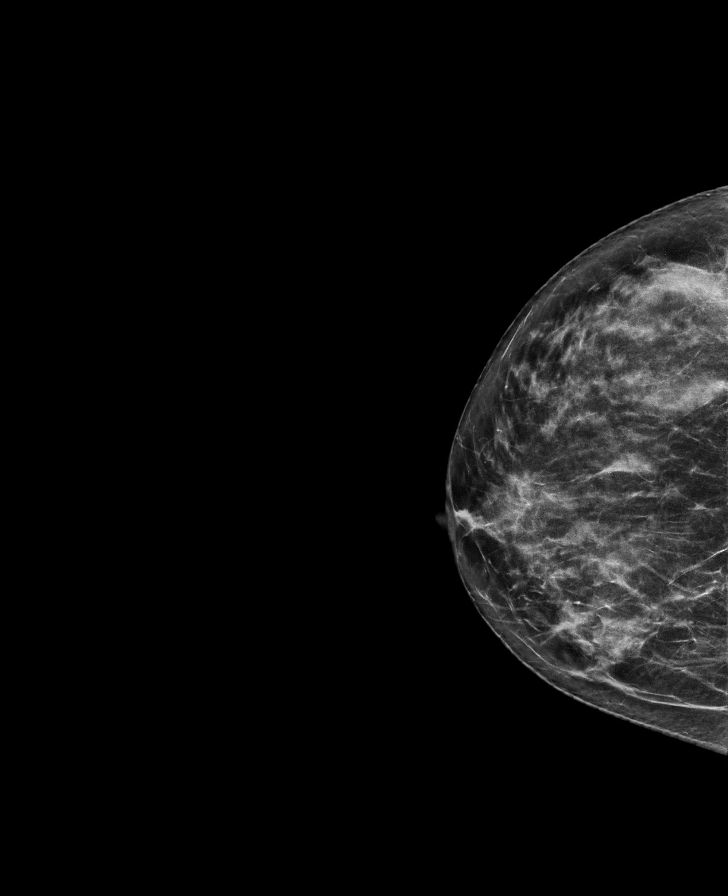

[L MLO synth-2D]
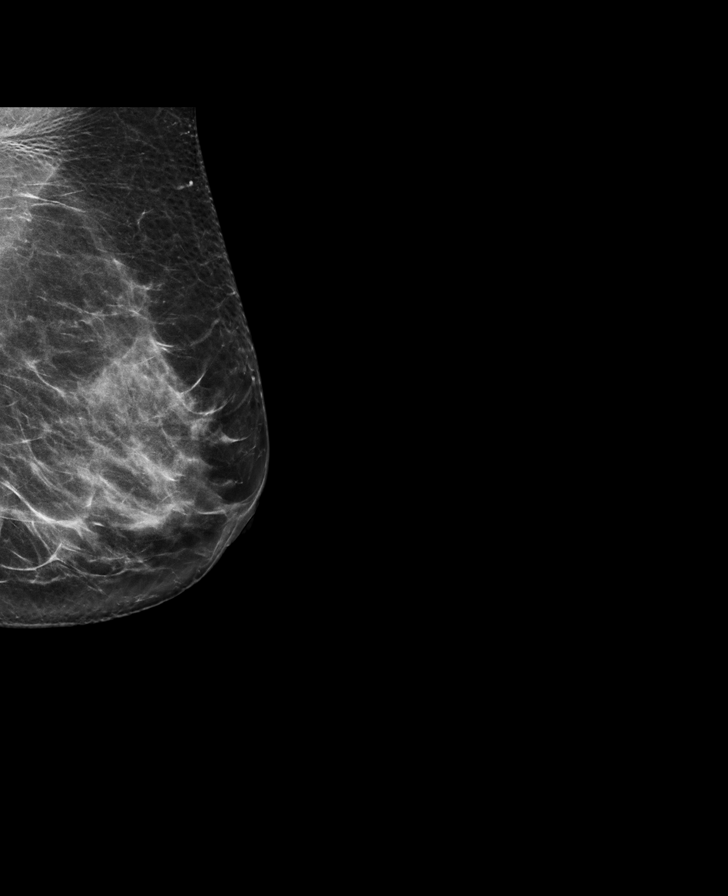

[L CC synth-2D]
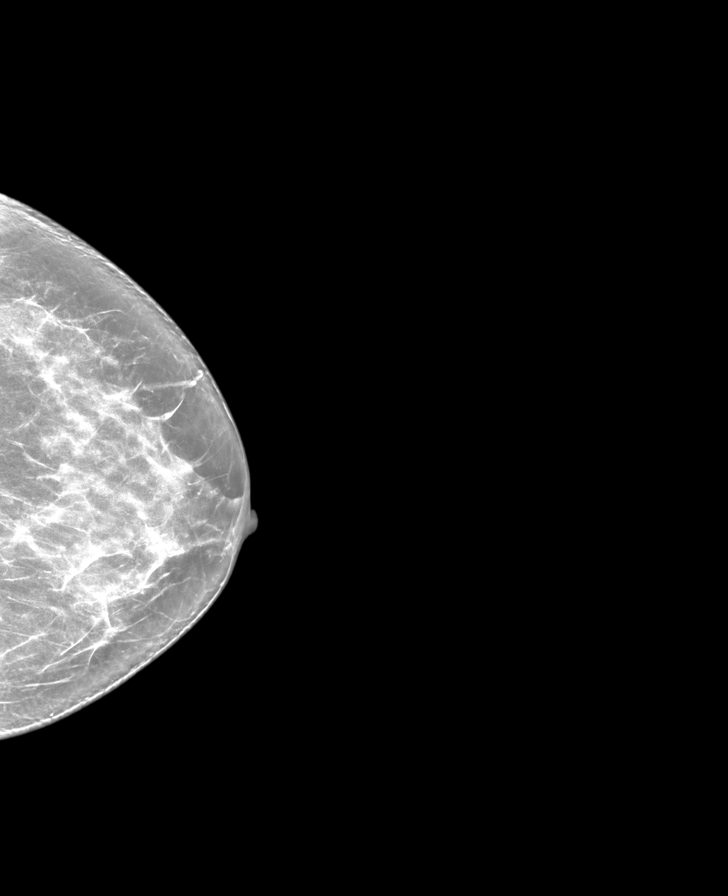

[R MLO tomo · tomo slice 38/75.0]
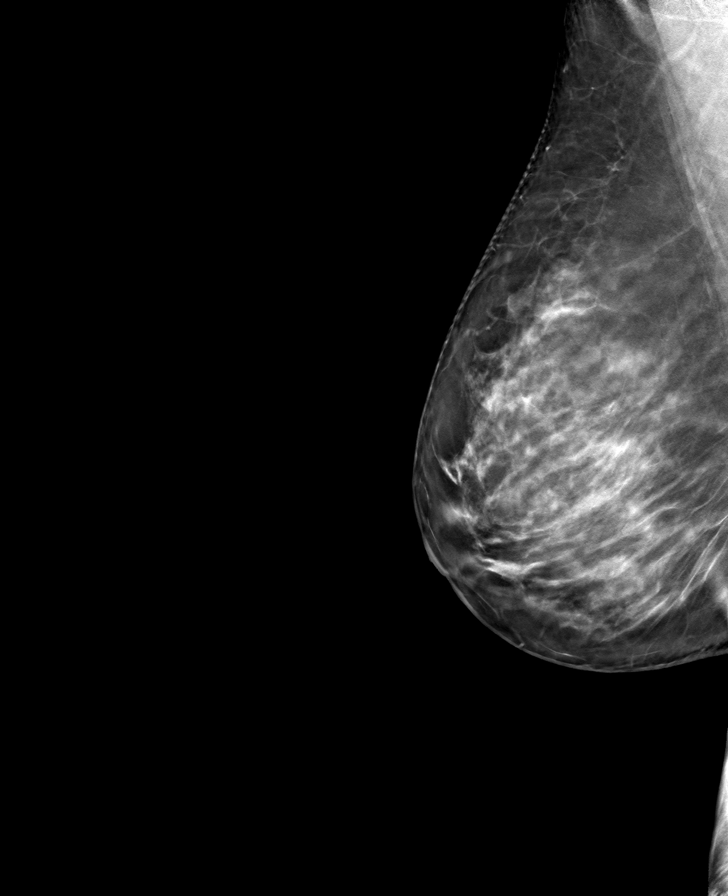

[L CC tomo · tomo slice 36/71.0]
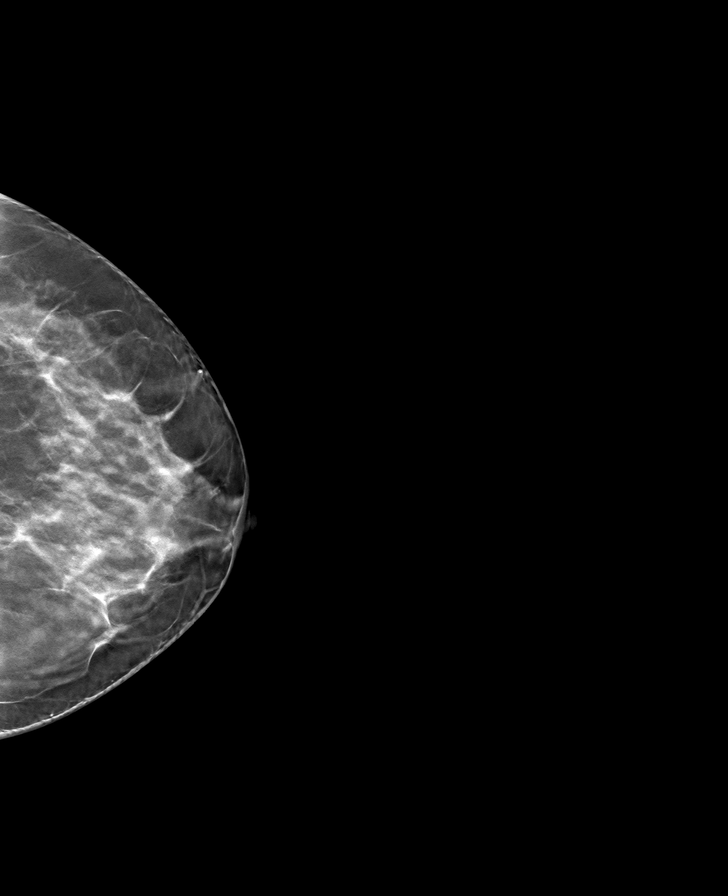

[L MLO tomo · tomo slice 39/76.0]
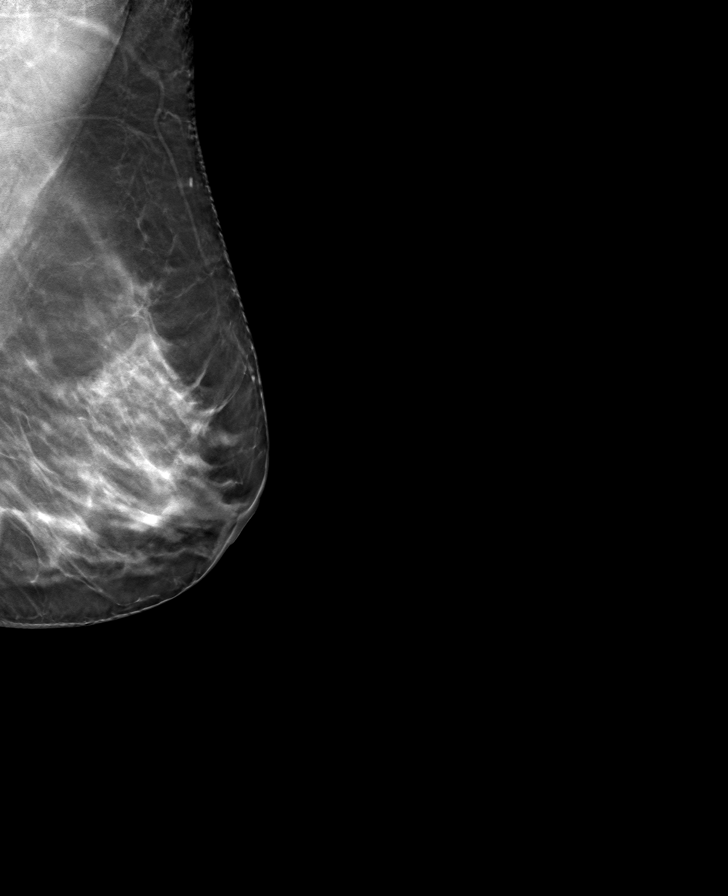

[R CC tomo · tomo slice 33/65.0]
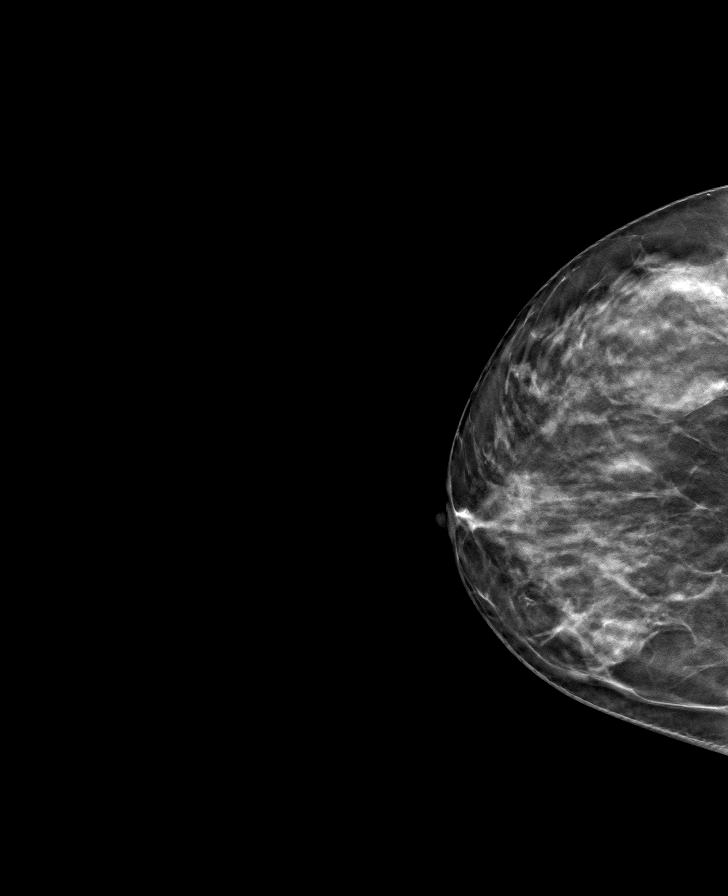

[8 of 24 positions shown; findings below may reference images not displayed]

ACR Breast Density Category c: The breast tissue is heterogeneously
dense, which may obscure small masses.
FINDINGS: There are no findings suspicious for malignancy. Images were
processed with CAD.
IMPRESSION: No mammographic evidence of malignancy. A result letter of this
screening mammogram will be mailed directly to the patient.

RECOMMENDATION:
Screening mammogram in one year. (Code:FT-U-LHB)

BI-RADS CATEGORY  1: Negative.

## 2018-03-01 DIAGNOSIS — L659 Nonscarring hair loss, unspecified: Secondary | ICD-10-CM | POA: Diagnosis not present

## 2018-03-01 DIAGNOSIS — J309 Allergic rhinitis, unspecified: Secondary | ICD-10-CM | POA: Diagnosis not present

## 2018-03-01 DIAGNOSIS — J069 Acute upper respiratory infection, unspecified: Secondary | ICD-10-CM | POA: Diagnosis not present

## 2018-03-01 DIAGNOSIS — Z0000 Encounter for general adult medical examination without abnormal findings: Secondary | ICD-10-CM | POA: Diagnosis not present

## 2018-03-01 DIAGNOSIS — N951 Menopausal and female climacteric states: Secondary | ICD-10-CM | POA: Diagnosis not present

## 2018-03-21 ENCOUNTER — Other Ambulatory Visit: Payer: Self-pay | Admitting: Family Medicine

## 2018-03-21 DIAGNOSIS — Z1231 Encounter for screening mammogram for malignant neoplasm of breast: Secondary | ICD-10-CM

## 2018-05-14 ENCOUNTER — Ambulatory Visit
Admission: RE | Admit: 2018-05-14 | Discharge: 2018-05-14 | Disposition: A | Discharge status: Home / Self Care | Payer: BLUE CROSS/BLUE SHIELD | Source: Ambulatory Visit | Attending: Family Medicine | Admitting: Family Medicine

## 2018-05-14 DIAGNOSIS — Z1231 Encounter for screening mammogram for malignant neoplasm of breast: Secondary | ICD-10-CM | POA: Diagnosis not present

## 2018-11-20 DIAGNOSIS — Z20828 Contact with and (suspected) exposure to other viral communicable diseases: Secondary | ICD-10-CM | POA: Diagnosis not present

## 2018-11-27 DIAGNOSIS — Z20828 Contact with and (suspected) exposure to other viral communicable diseases: Secondary | ICD-10-CM | POA: Diagnosis not present

## 2018-12-03 DIAGNOSIS — Z20828 Contact with and (suspected) exposure to other viral communicable diseases: Secondary | ICD-10-CM | POA: Diagnosis not present

## 2018-12-10 DIAGNOSIS — Z20828 Contact with and (suspected) exposure to other viral communicable diseases: Secondary | ICD-10-CM | POA: Diagnosis not present

## 2018-12-17 DIAGNOSIS — Z20828 Contact with and (suspected) exposure to other viral communicable diseases: Secondary | ICD-10-CM | POA: Diagnosis not present

## 2018-12-24 DIAGNOSIS — Z20828 Contact with and (suspected) exposure to other viral communicable diseases: Secondary | ICD-10-CM | POA: Diagnosis not present

## 2019-01-07 DIAGNOSIS — Z20828 Contact with and (suspected) exposure to other viral communicable diseases: Secondary | ICD-10-CM | POA: Diagnosis not present

## 2019-01-21 DIAGNOSIS — Z20828 Contact with and (suspected) exposure to other viral communicable diseases: Secondary | ICD-10-CM | POA: Diagnosis not present

## 2019-01-28 DIAGNOSIS — Z20828 Contact with and (suspected) exposure to other viral communicable diseases: Secondary | ICD-10-CM | POA: Diagnosis not present

## 2019-02-05 ENCOUNTER — Other Ambulatory Visit: Payer: Self-pay | Admitting: Family Medicine

## 2019-02-05 DIAGNOSIS — Z1231 Encounter for screening mammogram for malignant neoplasm of breast: Secondary | ICD-10-CM

## 2019-04-02 DIAGNOSIS — Z13220 Encounter for screening for lipoid disorders: Secondary | ICD-10-CM | POA: Diagnosis not present

## 2019-04-02 DIAGNOSIS — Z0000 Encounter for general adult medical examination without abnormal findings: Secondary | ICD-10-CM | POA: Diagnosis not present

## 2019-04-02 DIAGNOSIS — N951 Menopausal and female climacteric states: Secondary | ICD-10-CM | POA: Diagnosis not present

## 2019-05-15 ENCOUNTER — Ambulatory Visit
Admission: RE | Admit: 2019-05-15 | Discharge: 2019-05-15 | Disposition: A | Discharge status: Home / Self Care | Payer: BC Managed Care – PPO | Source: Ambulatory Visit | Attending: Family Medicine | Admitting: Family Medicine

## 2019-05-15 ENCOUNTER — Other Ambulatory Visit: Payer: Self-pay

## 2019-05-15 DIAGNOSIS — Z1231 Encounter for screening mammogram for malignant neoplasm of breast: Secondary | ICD-10-CM

## 2020-01-25 ENCOUNTER — Emergency Department (HOSPITAL_COMMUNITY)
Admission: EM | Admit: 2020-01-25 | Discharge: 2020-01-25 | Disposition: A | Discharge status: Home / Self Care | Payer: BC Managed Care – PPO | Attending: Emergency Medicine | Admitting: Emergency Medicine

## 2020-01-25 ENCOUNTER — Other Ambulatory Visit: Payer: Self-pay

## 2020-01-25 ENCOUNTER — Emergency Department (HOSPITAL_COMMUNITY): Payer: BC Managed Care – PPO

## 2020-01-25 DIAGNOSIS — M533 Sacrococcygeal disorders, not elsewhere classified: Secondary | ICD-10-CM | POA: Diagnosis not present

## 2020-01-25 DIAGNOSIS — R0989 Other specified symptoms and signs involving the circulatory and respiratory systems: Secondary | ICD-10-CM | POA: Insufficient documentation

## 2020-01-25 DIAGNOSIS — T189XXA Foreign body of alimentary tract, part unspecified, initial encounter: Secondary | ICD-10-CM | POA: Diagnosis not present

## 2020-01-25 MED ORDER — ONDANSETRON 4 MG PO TBDP
4.0000 mg | ORAL_TABLET | Freq: Once | ORAL | Status: DC
Start: 2020-01-25 — End: 2020-01-25
  Filled 2020-01-25: qty 1

## 2020-01-25 NOTE — ED Provider Notes (Signed)
[facility]-EMERGENCY DEPT Provider Note   CSN: [id number] Arrival date & time: 01/25/20  1405     History Chief Complaint  Patient presents with  . Fishbone in throat    [name] [name] is a 59 y.o. female.  Reports to ER with sensation of fishbone stuck in her throat.  Patient states that she was eating fish with some bones earlier today.  States that she felt like she had a sensation of a fishbone being stuck in her throat.  Reports her husband thinks he saw it but could not get to it.  She has not tried drink or eat anything since.  No difficulty breathing.  Reports that the fishbone would have been very small.  HPI     No past medical history on file.  There are no problems to display for this patient.   Past Surgical History:  Procedure Laterality Date  . CESAREAN SECTION       OB History   No obstetric history on file.     Family History  Problem Relation Age of Onset  . Hypertension Mother   . Hypertension Father   . Hyperlipidemia Father   . Stroke Father   . Hypertension Brother   . Breast cancer Neg Hx     Social History   Tobacco Use  . Smoking status: Never Smoker  . Smokeless tobacco: Never Used  Substance Use Topics  . Alcohol use: No    Alcohol/week: 0.0 standard drinks  . Drug use: No    Home Medications Prior to Admission medications   Medication Sig Start Date End Date Taking? Authorizing Provider  Omega-3 Fatty Acids (FISH OIL) 1000 MG CAPS Take 1 capsule by mouth daily.    [provider]    Allergies    Patient has no known allergies.  Review of Systems   Review of Systems  Constitutional: Negative for chills, fatigue and fever.  HENT: Positive for sore throat.   Respiratory: Negative for shortness of breath.   Cardiovascular: Negative for chest pain.  Gastrointestinal: Negative for nausea and vomiting.    Physical Exam Updated Vital Signs BP (!) 142/75 (BP Location: Right Arm)   Pulse 76    Temp 98.2 F (36.8 C) (Oral)   Resp 17   LMP 03/02/2011   SpO2 97%   Physical Exam Vitals and nursing note reviewed.  Constitutional:      General: She is not in acute distress.    Appearance: She is well-developed.  HENT:     Head: Normocephalic and atraumatic.     Mouth/Throat:     Comments: Oropharynx is clear, no foreign body identified Eyes:     Conjunctiva/sclera: Conjunctivae normal.  Neck:     Comments: No tenderness Cardiovascular:     Rate and Rhythm: Normal rate and regular rhythm.     Heart sounds: No murmur heard.   Pulmonary:     Effort: Pulmonary effort is normal. No respiratory distress.  Abdominal:     Palpations: Abdomen is soft.     Tenderness: There is no abdominal tenderness.  Musculoskeletal:     Cervical back: Neck supple.  Skin:    General: Skin is warm and dry.  Neurological:     Mental Status: She is alert.  Psychiatric:        Mood and Affect: Mood normal.        Behavior: Behavior normal.     ED Results / Procedures / Treatments   Labs (  all labs ordered are listed, but only abnormal results are displayed) Labs Reviewed - No data to display  EKG None  Radiology DG Neck Soft Tissue  Result Date: 01/25/2020 CLINICAL DATA:  Swallowed a fish bone EXAM: NECK SOFT TISSUES - 1+ VIEW COMPARISON:  None. FINDINGS: The cervical spine is visualized from C1-C7. No unexpected radiopaque foreign body is visualized.Mild reversal of the cervical lordosis without evidence of spondylolisthesis. Vertebral body heights are maintained: no evidence of acute fracture. Intervertebral spaces are maintained without significant degenerative changes. No prevertebral soft tissue swelling. Visualized thorax is unremarkable. IMPRESSION: 1. No unexpected radiopaque foreign body identified. Electronically Signed   By: Meda Klinefelter MD   On: 01/25/2020 15:33   DG Chest 1 View  Result Date: 01/25/2020 CLINICAL DATA:  Swallowed a fish from EXAM: CHEST  1 VIEW  COMPARISON:  June 15, 2016. FINDINGS: The cardiomediastinal silhouette is normal in contour. No pleural effusion. No pneumothorax. No acute pleuroparenchymal abnormality. Visualized abdomen is unremarkable. No acute osseous abnormality noted. No unexpected radiopaque foreign body. IMPRESSION: 1.  No acute cardiopulmonary abnormality. 2. No unexpected radiopaque foreign body. Electronically Signed   By: Meda Klinefelter MD   On: 01/25/2020 15:38   DG Abdomen 1 View  Result Date: 01/25/2020 CLINICAL DATA:  Swallowed a fish bone EXAM: ABDOMEN - 1 VIEW COMPARISON:  None. FINDINGS: Air and stool-filled nondilated loops of bowel. Moderate colonic stool burden diffusely throughout the colon. Visualized lung bases are unremarkable. There is a 15 mm linear radiopaque density overlying the region of the LEFT sacroiliac joint. IMPRESSION: 1. A 15 mm linear radiopaque density overlying the region of the LEFT sacroiliac joint. This is nonspecific and could reflect a summation artifact of overlapping soft tissues versus a reported ingested fishbone. Please note that the majority of fish bones are not radiopaque. Electronically Signed   By: Meda Klinefelter MD   On: 01/25/2020 15:37    Procedures Procedures (including critical care time)  Medications Ordered in ED Medications - No data to display  ED Course  I have reviewed the triage vital signs and the nursing notes.  Pertinent labs & imaging results that were available during my care of the patient were reviewed by me and considered in my medical decision making (see chart for details).    MDM Rules/Calculators/A&P                         59 year old lady presenting to ER with concern for foreign body sensation in throat, possible fishbone.  On exam well-appearing, unable to identify any retained foreign body on attempted direct visualization of her oropharynx.  No palpable deformities on neck exam.  Plain films were negative for foreign body.  Patient  is tolerating p.o. without any difficulty, eating and drinking.  No respiratory complaints.  I recommend patient follow-up with ENT on Monday, discharged home.  After the discussed management above, the patient was determined to be safe for discharge.  The patient was in agreement with this plan and all questions regarding their care were answered.  ED return precautions were discussed and the patient will return to the ED with any significant worsening of condition.    Final Clinical Impression(s) / ED Diagnoses Final diagnoses:  Foreign body sensation in throat    Rx / DC Orders ED Discharge Orders    None       Milagros Loll, MD 01/25/20 2246

## 2020-01-25 NOTE — ED Triage Notes (Signed)
Patient states she swallowed a fish bone on accident when eating. No trouble breathing but is able to still feel the fish bone

## 2020-01-25 NOTE — ED Notes (Signed)
Water given to patient, no coughing with drink. Patient states she can still feel the bone in her throat

## 2020-01-25 NOTE — ED Notes (Signed)
Patient states she still feels like the fishbone is stuck in her throat

## 2020-01-25 NOTE — ED Notes (Signed)
Pt to xray

## 2020-01-25 NOTE — Discharge Instructions (Addendum)
Please get a follow-up appointment with the ear nose and throat doctor to be seen ideally on Monday.  If you develop nausea, vomiting, any difficulty in swallowing, return to ER for reassessment.

## 2020-03-26 DIAGNOSIS — Z131 Encounter for screening for diabetes mellitus: Secondary | ICD-10-CM | POA: Diagnosis not present

## 2020-03-26 DIAGNOSIS — Z0000 Encounter for general adult medical examination without abnormal findings: Secondary | ICD-10-CM | POA: Diagnosis not present

## 2020-03-26 DIAGNOSIS — Z124 Encounter for screening for malignant neoplasm of cervix: Secondary | ICD-10-CM | POA: Diagnosis not present

## 2020-03-26 DIAGNOSIS — Z13220 Encounter for screening for lipoid disorders: Secondary | ICD-10-CM | POA: Diagnosis not present

## 2020-03-26 DIAGNOSIS — Z23 Encounter for immunization: Secondary | ICD-10-CM | POA: Diagnosis not present

## 2020-05-04 ENCOUNTER — Other Ambulatory Visit: Payer: Self-pay | Admitting: Family Medicine

## 2020-05-04 DIAGNOSIS — Z1231 Encounter for screening mammogram for malignant neoplasm of breast: Secondary | ICD-10-CM

## 2020-06-23 ENCOUNTER — Ambulatory Visit
Admission: RE | Admit: 2020-06-23 | Discharge: 2020-06-23 | Disposition: A | Discharge status: Home / Self Care | Payer: BC Managed Care – PPO | Source: Ambulatory Visit | Attending: Family Medicine | Admitting: Family Medicine

## 2020-06-23 ENCOUNTER — Other Ambulatory Visit: Payer: Self-pay

## 2020-06-23 DIAGNOSIS — Z1231 Encounter for screening mammogram for malignant neoplasm of breast: Secondary | ICD-10-CM | POA: Diagnosis not present

## 2020-07-15 DIAGNOSIS — Z20822 Contact with and (suspected) exposure to covid-19: Secondary | ICD-10-CM | POA: Diagnosis not present

## 2021-03-31 ENCOUNTER — Other Ambulatory Visit: Payer: Self-pay | Admitting: Family Medicine

## 2021-03-31 DIAGNOSIS — Z1231 Encounter for screening mammogram for malignant neoplasm of breast: Secondary | ICD-10-CM

## 2021-04-09 DIAGNOSIS — E7800 Pure hypercholesterolemia, unspecified: Secondary | ICD-10-CM | POA: Diagnosis not present

## 2021-04-09 DIAGNOSIS — N951 Menopausal and female climacteric states: Secondary | ICD-10-CM | POA: Diagnosis not present

## 2021-04-09 DIAGNOSIS — Z0000 Encounter for general adult medical examination without abnormal findings: Secondary | ICD-10-CM | POA: Diagnosis not present

## 2021-04-09 DIAGNOSIS — J309 Allergic rhinitis, unspecified: Secondary | ICD-10-CM | POA: Diagnosis not present

## 2021-04-09 DIAGNOSIS — I10 Essential (primary) hypertension: Secondary | ICD-10-CM | POA: Diagnosis not present

## 2021-05-10 DIAGNOSIS — I10 Essential (primary) hypertension: Secondary | ICD-10-CM | POA: Diagnosis not present

## 2021-06-24 ENCOUNTER — Ambulatory Visit
Admission: RE | Admit: 2021-06-24 | Discharge: 2021-06-24 | Disposition: A | Discharge status: Home / Self Care | Payer: BC Managed Care – PPO | Source: Ambulatory Visit | Attending: Family Medicine | Admitting: Family Medicine

## 2021-06-24 DIAGNOSIS — Z1231 Encounter for screening mammogram for malignant neoplasm of breast: Secondary | ICD-10-CM | POA: Diagnosis not present

## 2022-05-26 ENCOUNTER — Other Ambulatory Visit: Payer: Self-pay | Admitting: Internal Medicine

## 2022-05-26 DIAGNOSIS — Z1231 Encounter for screening mammogram for malignant neoplasm of breast: Secondary | ICD-10-CM

## 2022-08-02 ENCOUNTER — Ambulatory Visit: Payer: BC Managed Care – PPO

## 2022-08-15 ENCOUNTER — Ambulatory Visit: Payer: BC Managed Care – PPO

## 2023-04-04 ENCOUNTER — Ambulatory Visit: Payer: Self-pay

## 2023-04-06 ENCOUNTER — Ambulatory Visit
Admission: RE | Admit: 2023-04-06 | Discharge: 2023-04-06 | Disposition: A | Discharge status: Home / Self Care | Payer: No Typology Code available for payment source | Source: Ambulatory Visit | Attending: Internal Medicine | Admitting: Internal Medicine

## 2023-04-06 DIAGNOSIS — Z1231 Encounter for screening mammogram for malignant neoplasm of breast: Secondary | ICD-10-CM

## 2023-05-05 ENCOUNTER — Other Ambulatory Visit: Payer: Self-pay | Admitting: Internal Medicine

## 2023-05-05 DIAGNOSIS — E2839 Other primary ovarian failure: Secondary | ICD-10-CM

## 2024-02-27 ENCOUNTER — Other Ambulatory Visit: Payer: Self-pay | Admitting: Internal Medicine

## 2024-02-27 DIAGNOSIS — Z1231 Encounter for screening mammogram for malignant neoplasm of breast: Secondary | ICD-10-CM

## 2024-04-08 ENCOUNTER — Ambulatory Visit

## 2024-04-16 ENCOUNTER — Ambulatory Visit

## 2024-04-23 ENCOUNTER — Ambulatory Visit
